# Patient Record
Sex: Male | Born: 1975 | Race: White | Hispanic: No | Marital: Single | Smoking: Current some day smoker
Health system: Southern US, Community
[De-identification: ages and names within clinical notes are randomized; demographics above are authoritative.]

## PROBLEM LIST (undated history)

## (undated) DIAGNOSIS — K5090 Crohn's disease, unspecified, without complications: Secondary | ICD-10-CM

---

## 2010-02-23 ENCOUNTER — Emergency Department (HOSPITAL_COMMUNITY): Admission: EM | Admit: 2010-02-23 | Discharge: 2010-02-23 | Payer: Self-pay | Admitting: Emergency Medicine

## 2014-02-10 ENCOUNTER — Emergency Department (HOSPITAL_COMMUNITY): Payer: Worker's Compensation

## 2014-02-10 ENCOUNTER — Encounter (HOSPITAL_COMMUNITY): Payer: Self-pay | Admitting: Emergency Medicine

## 2014-02-10 ENCOUNTER — Emergency Department (HOSPITAL_COMMUNITY)
Admission: EM | Admit: 2014-02-10 | Discharge: 2014-02-10 | Disposition: A | Discharge status: Home / Self Care | Payer: Worker's Compensation | Attending: Emergency Medicine | Admitting: Emergency Medicine

## 2014-02-10 DIAGNOSIS — S0990XA Unspecified injury of head, initial encounter: Secondary | ICD-10-CM | POA: Diagnosis present

## 2014-02-10 DIAGNOSIS — Z8719 Personal history of other diseases of the digestive system: Secondary | ICD-10-CM | POA: Insufficient documentation

## 2014-02-10 DIAGNOSIS — Y9389 Activity, other specified: Secondary | ICD-10-CM | POA: Diagnosis not present

## 2014-02-10 DIAGNOSIS — Y9289 Other specified places as the place of occurrence of the external cause: Secondary | ICD-10-CM | POA: Diagnosis not present

## 2014-02-10 DIAGNOSIS — IMO0002 Reserved for concepts with insufficient information to code with codable children: Secondary | ICD-10-CM | POA: Insufficient documentation

## 2014-02-10 DIAGNOSIS — Z79899 Other long term (current) drug therapy: Secondary | ICD-10-CM | POA: Insufficient documentation

## 2014-02-10 DIAGNOSIS — Z791 Long term (current) use of non-steroidal anti-inflammatories (NSAID): Secondary | ICD-10-CM | POA: Diagnosis not present

## 2014-02-10 DIAGNOSIS — Y990 Civilian activity done for income or pay: Secondary | ICD-10-CM | POA: Diagnosis not present

## 2014-02-10 DIAGNOSIS — F17200 Nicotine dependence, unspecified, uncomplicated: Secondary | ICD-10-CM | POA: Insufficient documentation

## 2014-02-10 HISTORY — DX: Crohn's disease, unspecified, without complications: K50.90

## 2014-02-10 MED ORDER — KETOROLAC TROMETHAMINE 15 MG/ML IJ SOLN
15.0000 mg | Freq: Once | INTRAMUSCULAR | Status: AC
  Administered 2014-02-10: 15 mg via INTRAMUSCULAR
  Filled 2014-02-10: qty 1

## 2014-02-10 MED ORDER — KETOROLAC TROMETHAMINE 15 MG/ML IJ SOLN: 15.0000 mg | Freq: Once | INTRAMUSCULAR | Status: DC

## 2014-02-10 NOTE — Discharge Instructions (Signed)
Concussion °A concussion is a brain injury. It is caused by: °· A hit to the head. °· A quick and sudden movement (jolt) of the head or neck. °A concussion is usually not life threatening. Even so, it can cause serious problems. If you had a concussion before, you may have concussion-like problems after a hit to your head. °HOME CARE °General Instructions °· Follow your doctor's directions carefully. °· Take medicines only as told by your doctor. °· Only take medicines your doctor says are safe. °· Do not drink alcohol until your doctor says it is okay. Alcohol and some drugs can slow down healing. They can also put you at risk for further injury. °· If you are having trouble remembering things, write them down. °· Try to do one thing at a time if you get distracted easily. For example, do not watch TV while making dinner. °· Talk to your family members or close friends when making important decisions. °· Follow up with your doctor as told. °· Watch your symptoms. Tell others to do the same. Serious problems can sometimes happen after a concussion. Older adults are more likely to have these problems. °· Tell your teachers, school nurse, school counselor, coach, athletic trainer, or work manager about your concussion. Tell them about what you can or cannot do. They should watch to see if: °¨ It gets even harder for you to pay attention or concentrate. °¨ It gets even harder for you to remember things or learn new things. °¨ You need more time than normal to finish things. °¨ You become annoyed (irritable) more than before. °¨ You are not able to deal with stress as well. °¨ You have more problems than before. °· Rest. Make sure you: °¨ Get plenty of sleep at night. °¨ Go to sleep early. °¨ Go to bed at the same time every day. Try to wake up at the same time. °¨ Rest during the day. °¨ Take naps when you feel tired. °· Limit activities where you have to think a lot or concentrate. These include: °¨ Doing  homework. °¨ Doing work related to a job. °¨ Watching TV. °¨ Using the computer. °Returning To Your Regular Activities °Return to your normal activities slowly, not all at once. You must give your body and brain enough time to heal.  °· Do not play sports or do other athletic activities until your doctor says it is okay. °· Ask your doctor when you can drive, ride a bicycle, or work other vehicles or machines. Never do these things if you feel dizzy. °· Ask your doctor about when you can return to work or school. °Preventing Another Concussion °It is very important to avoid another brain injury, especially before you have healed. In rare cases, another injury can lead to permanent brain damage, brain swelling, or death. The risk of this is greatest during the first 7-10 days after your injury. Avoid injuries by:  °· Wearing a seat belt when riding in a car. °· Not drinking too much alcohol. °· Avoiding activities that could lead to a second concussion (such as contact sports). °· Wearing a helmet when doing activities like: °¨ Biking. °¨ Skiing. °¨ Skateboarding. °¨ Skating. °· Making your home safer by: °¨ Removing things from the floor or stairways that could make you trip. °¨ Using grab bars in bathrooms and handrails by stairs. °¨ Placing non-slip mats on floors and in bathtubs. °¨ Improve lighting in dark areas. °GET HELP IF: °· It   gets even harder for you to pay attention or concentrate. °· It gets even harder for you to remember things or learn new things. °· You need more time than normal to finish things. °· You become annoyed (irritable) more than before. °· You are not able to deal with stress as well. °· You have more problems than before. °· You have problems keeping your balance. °· You are not able to react quickly when you should. °Get help if you have any of these problems for more than 2 weeks:  °· Lasting (chronic) headaches. °· Dizziness or trouble balancing. °· Feeling sick to your stomach  (nausea). °· Seeing (vision) problems. °· Being affected by noises or light more than normal. °· Feeling sad, low, down in the dumps, blue, gloomy, or empty (depressed). °· Mood changes (mood swings). °· Feeling of fear or nervousness about what may happen (anxiety). °· Feeling annoyed. °· Memory problems. °· Problems concentrating or paying attention. °· Sleep problems. °· Feeling tired all the time. °GET HELP RIGHT AWAY IF:  °· You have bad headaches or your headaches get worse. °· You have weakness (even if it is in one hand, leg, or part of the face). °· You have loss of feeling (numbness). °· You feel off balance. °· You keep throwing up (vomiting). °· You feel tired. °· One black center of your eye (pupil) is larger than the other. °· You twitch or shake violently (convulse). °· Your speech is not clear (slurred). °· You are more confused, easily angered (agitated), or annoyed than before. °· You have more trouble resting than before. °· You are unable to recognize people or places. °· You have neck pain. °· It is difficult to wake you up. °· You have unusual behavior changes. °· You pass out (lose consciousness). °MAKE SURE YOU:  °· Understand these instructions. °· Will watch your condition. °· Will get help right away if you are not doing well or get worse. °Document Released: 06/26/2009 Document Revised: 11/22/2013 Document Reviewed: 01/28/2013 °[employer]® Patient Information ©2015 [employer]. This information is not intended to replace advice given to you by your health care provider. Make sure you discuss any questions you have with your health care provider. ° °

## 2014-02-10 NOTE — ED Notes (Signed)
Pt hit head today and had to have 12 staples  Placed in head- pt drove self to and from Dr. Meryle ReadyFamily reports she came home today at 6 and he was fine and then at 740 he passed out. Since then pt has had slurred speech, feeling sleepy and not acting liike self. No other injury when he passed out.

## 2014-02-10 NOTE — ED Notes (Signed)
Patient transported to CT 

## 2014-02-11 NOTE — ED Provider Notes (Signed)
CSN: [id number]     Arrival date & time 02/10/14  2015 History   First MD Initiated Contact with Patient 02/10/14 2120     Chief Complaint  Patient presents with  . Head Injury     (Consider location/radiation/quality/duration/timing/severity/associated sxs/prior Treatment) Patient is a 38 y.o. male presenting with headaches.  Headache Pain location:  Frontal Quality:  Sharp Severity currently:  8/10 Onset quality:  Gradual Duration:  1 day Associated symptoms: nausea   Associated symptoms: no abdominal pain, no back pain, no congestion and no cough     Past Medical History  Diagnosis Date  . Crohn's disease    History reviewed. No pertinent past surgical history. No family history on file. History  Substance Use Topics  . Smoking status: Current Some Day Smoker  . Smokeless tobacco: Not on file  . Alcohol Use: No    Review of Systems  Constitutional: Negative for activity change.  HENT: Negative for congestion.   Eyes: Negative for visual disturbance.  Respiratory: Negative for cough and shortness of breath.   Cardiovascular: Negative for chest pain and leg swelling.  Gastrointestinal: Positive for nausea. Negative for abdominal pain and blood in stool.  Genitourinary: Negative for dysuria and hematuria.  Musculoskeletal: Negative for back pain.  Skin: Negative for color change.  Neurological: Positive for headaches. Negative for syncope.  Psychiatric/Behavioral: Negative for agitation.  All other systems reviewed and are negative.     Allergies  Review of patient's allergies indicates no known allergies.  Home Medications   Prior to Admission medications   Medication Sig Start Date End Date Taking? Authorizing Provider  Cyanocobalamin (VITAMIN B-12 PO) Take 1 tablet by mouth daily.   Yes Historical Provider, MD  ibuprofen (ADVIL,MOTRIN) 100 MG tablet Take 400 mg by mouth every 6 (six) hours as needed for fever.   Yes Historical Provider, MD  loratadine  (CLARITIN) 10 MG tablet Take 10 mg by mouth daily.   Yes Historical Provider, MD  Multiple Vitamin (MULTIVITAMIN WITH MINERALS) TABS tablet Take 1 tablet by mouth daily.   Yes Historical Provider, MD   BP 107/65  Pulse 61  Temp(Src) 98 F (36.7 C) (Oral)  Resp 17  SpO2 96% Physical Exam  Nursing note and vitals reviewed. Constitutional: He is oriented to person, place, and time. He appears well-developed and well-nourished.  HENT:  Head: Normocephalic.  Eyes: Pupils are equal, round, and reactive to light.  Neck: Neck supple.  Cardiovascular: Normal rate and regular rhythm.  Exam reveals no gallop and no friction rub.   No murmur heard. Pulmonary/Chest: Effort normal. No respiratory distress.  Abdominal: Soft. He exhibits no distension. There is no tenderness.  Musculoskeletal: He exhibits no edema.  Neurological: He is alert and oriented to person, place, and time.  Skin: Skin is warm.  Psychiatric: He has a normal mood and affect.    ED Course  Procedures (including critical care time) Labs Review Labs Reviewed - No data to display  Imaging Review Ct Head Wo Contrast  02/10/2014   CLINICAL DATA:  Head injury with laceration in the vertex region. Headache and dizziness.  EXAM: CT HEAD WITHOUT CONTRAST  TECHNIQUE: Contiguous axial images were obtained from the base of the skull through the vertex without intravenous contrast.  COMPARISON:  None.  FINDINGS: There is no evidence of acute intracranial hemorrhage, mass lesion, brain edema or extra-axial fluid collection. The ventricles and subarachnoid spaces are appropriately sized for age. There is no CT evidence of acute cortical  infarction.  The visualized paranasal sinuses, mastoid air cells and middle ears are clear. The calvarium is intact. There are skin staples at the vertex. No other foreign bodies identified.  IMPRESSION: No evidence of acute intracranial injury or fracture.   Electronically Signed   By: Roxy HorsemanBill  Veazey M.D.    On: 02/10/2014 22:08     EKG Interpretation   Date/Time:  Thursday February 10 2014 21:21:01 EDT Ventricular Rate:  67 PR Interval:  138 QRS Duration: 97 QT Interval:  384 QTC Calculation: 405 R Axis:   52 Text Interpretation:  Sinus rhythm RSR' in V1 or V2, right VCD or RVH No  old tracing to compare Confirmed by GOLDSTON  MD, SCOTT ([id number]) on  02/10/2014 9:50:29 PM      MDM   Final diagnoses:  Head injury, initial encounter   38 year old male with no significant past medical history the presents after a head injury. Patient states that yesterday at work he hit his head very strongly upon a work truck. Patient noted to not have any neurological deficits, does have nausea vomiting. CT head was negative. Patient instructed on treatment with rest and work note provided. The patient was hemodynamically stable throughout his time in the emergency department and he was discharged with return precautions given    Clement SayresStaci Henson Fraticelli, MD 02/12/14 [id number]

## 2014-02-14 NOTE — ED Provider Notes (Signed)
I saw and evaluated the patient, reviewed the resident's note and I agree with the findings and plan.   EKG Interpretation   Date/Time:  Thursday February 10 2014 21:21:01 EDT Ventricular Rate:  67 PR Interval:  138 QRS Duration: 97 QT Interval:  384 QTC Calculation: 405 R Axis:   52 Text Interpretation:  Sinus rhythm RSR' in V1 or V2, right VCD or RVH No  old tracing to compare Confirmed by Parlee Amescua  MD, Demitra Danley ([id number]) on  02/10/2014 9:50:29 PM       Patient's syncope from his concussion, no evidence of more serious brain injury. D/C home with precautions.  Audree CamelScott T Jmari Pelc, MD 02/14/14 1409

## 2015-08-02 IMAGING — CT CT HEAD W/O CM
1 series · 16 of 30 positions shown, 20 images · non-contrast
Comparison: None.

CLINICAL DATA: Head injury with laceration in the vertex region.
Headache and dizziness.

EXAM:
CT HEAD WITHOUT CONTRAST
TECHNIQUE: Contiguous axial images were obtained from the base of the skull
through the vertex without intravenous contrast.

[Series 2: head 5.0 h30s · axial · 0.43mm/px · z∈[-49,+91]mm · 16 of 32 slices shown, 20 images]
[im 2/32  brain]
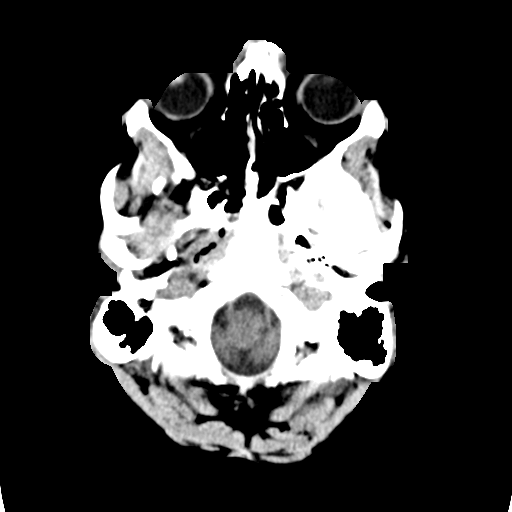
[im 2/32  bone]
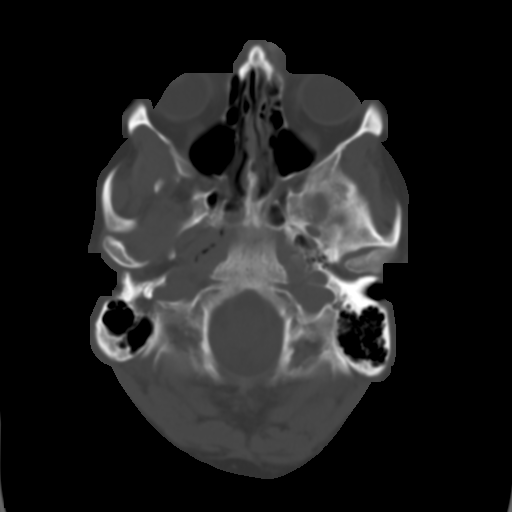
[im 4/32  brain]
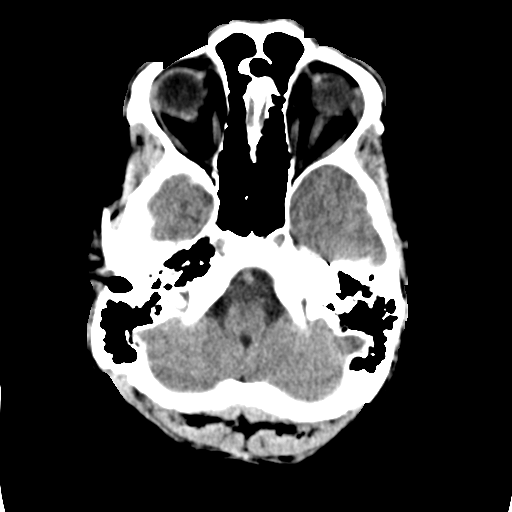
[im 6/32  brain]
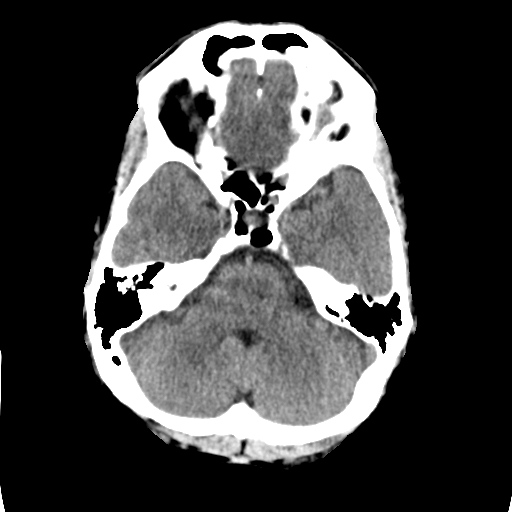
[im 8/32  brain]
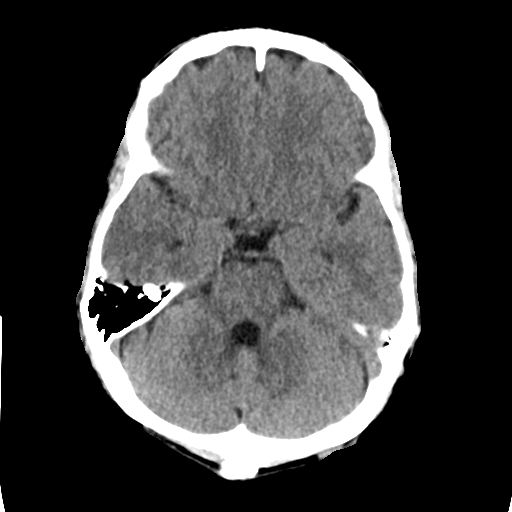
[im 9/32  brain]
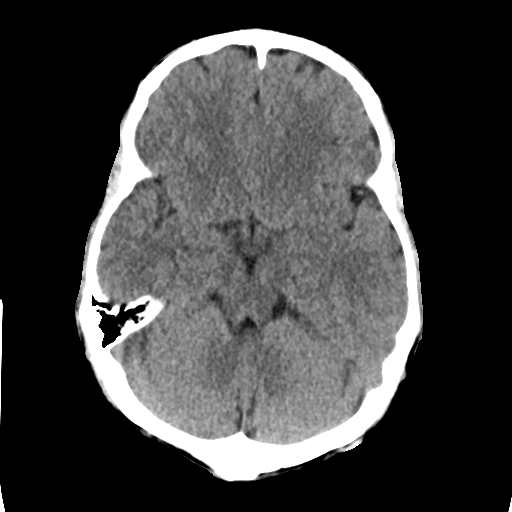
[im 9/32  bone]
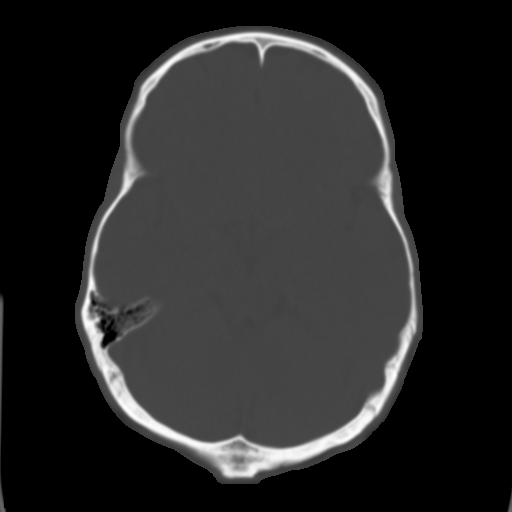
[im 11/32  brain]
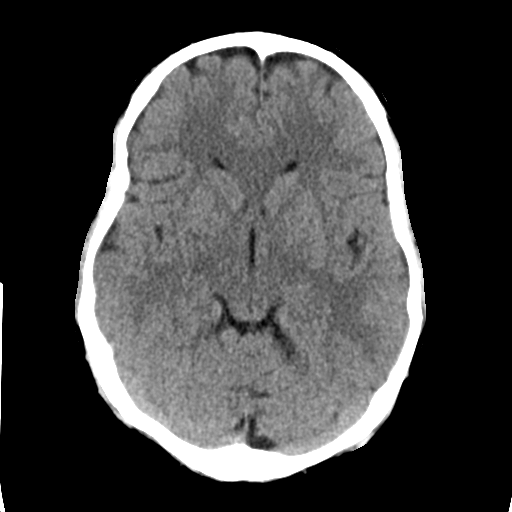
[im 13/32  brain]
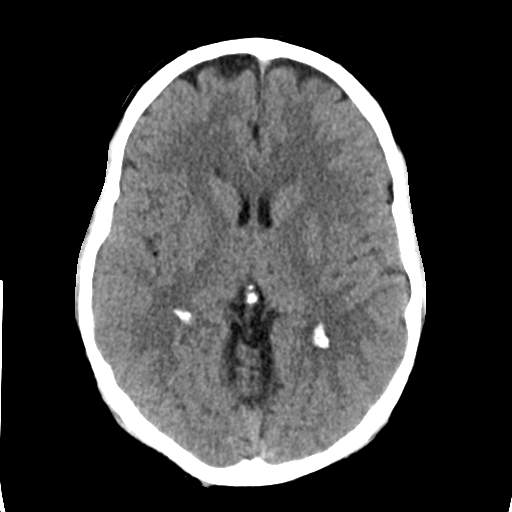
[im 15/32  brain]
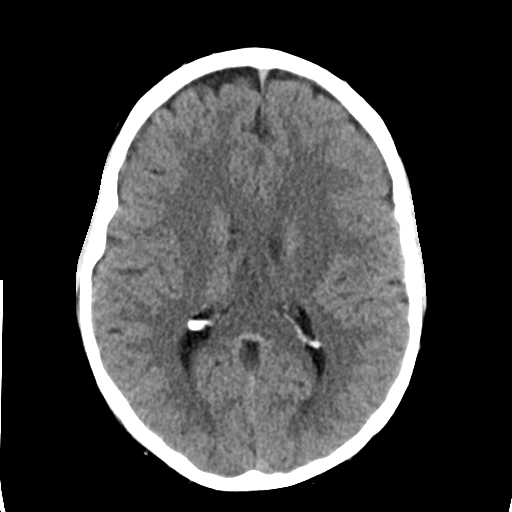
[im 17/32  brain]
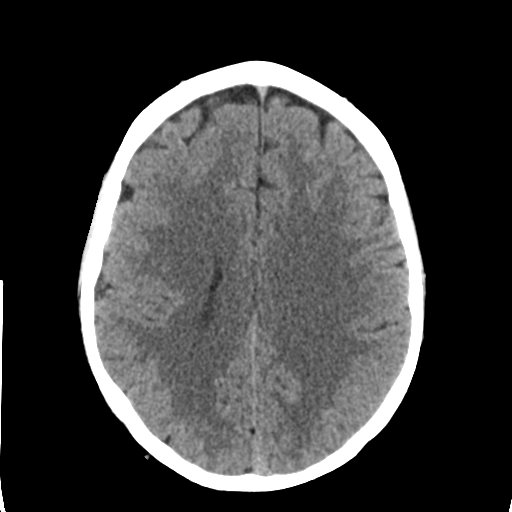
[im 17/32  bone]
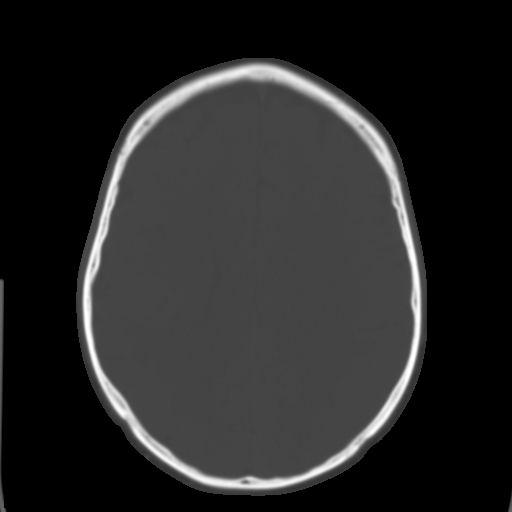
[im 19/32  brain]
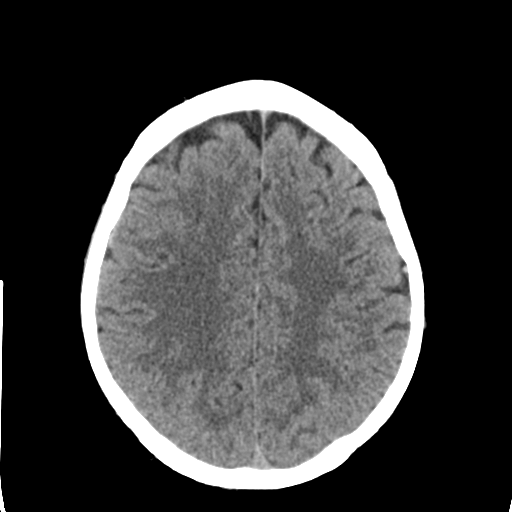
[im 21/32  brain]
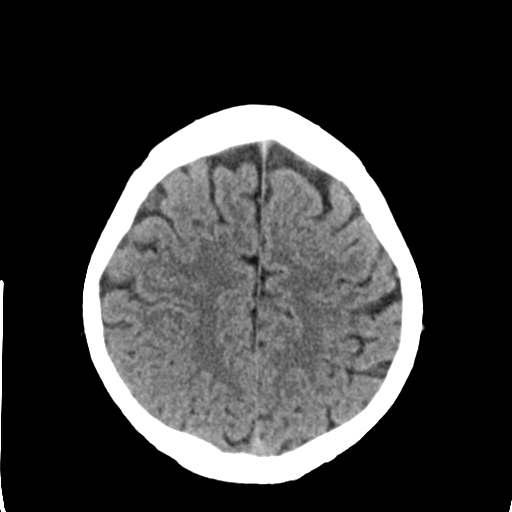
[im 23/32  brain]
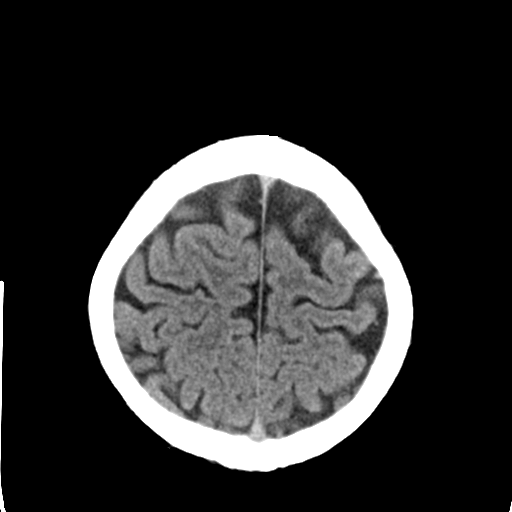
[im 24/32  brain]
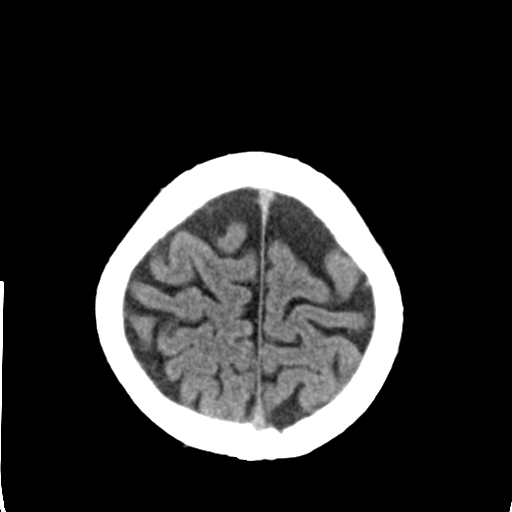
[im 24/32  bone]
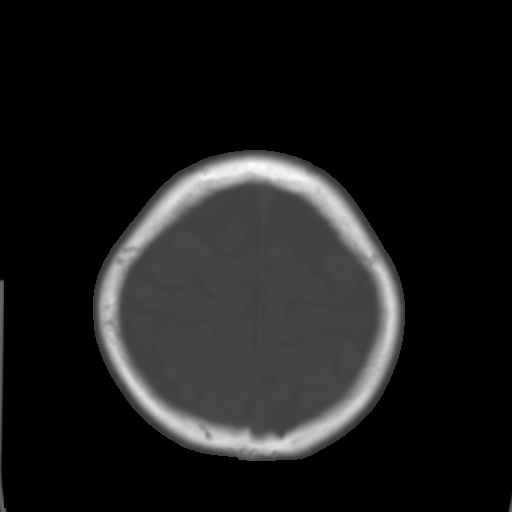
[im 26/32  brain]
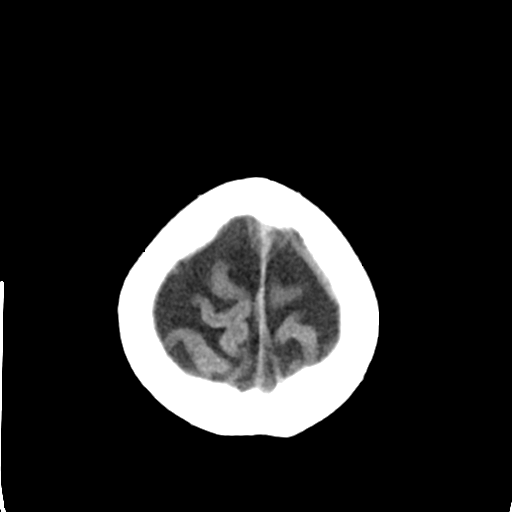
[im 28/32  brain]
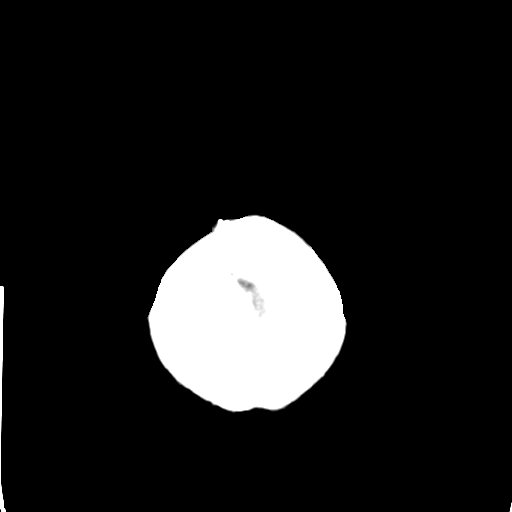
[im 30/32  brain]
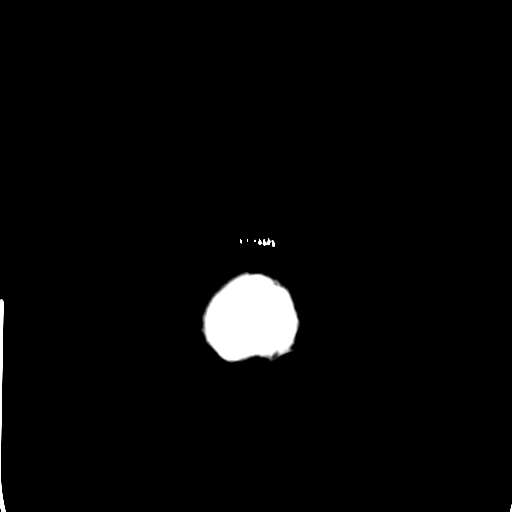

[16 of 30 positions shown; findings below may reference images not displayed]

FINDINGS: There is no evidence of acute intracranial hemorrhage, mass lesion,
brain edema or extra-axial fluid collection. The ventricles and
subarachnoid spaces are appropriately sized for age. There is no CT
evidence of acute cortical infarction.

The visualized paranasal sinuses, mastoid air cells and middle ears
are clear. The calvarium is intact. There are skin staples at the
vertex. No other foreign bodies identified.
IMPRESSION: No evidence of acute intracranial injury or fracture.
